# Patient Record
Sex: Female | Born: 1980 | Race: White | Hispanic: No | Marital: Married
Health system: Southern US, Community
[De-identification: ages and names within clinical notes are randomized; demographics above are authoritative.]

## PROBLEM LIST (undated history)

## (undated) HISTORY — PX: CHOLECYSTECTOMY: SHX55

## (undated) HISTORY — PX: RHINOPLASTY: SUR1284

---

## 2021-03-09 ENCOUNTER — Other Ambulatory Visit (HOSPITAL_COMMUNITY): Payer: Self-pay | Admitting: Neurology

## 2021-03-09 ENCOUNTER — Other Ambulatory Visit: Payer: Self-pay | Admitting: Neurology

## 2021-03-09 DIAGNOSIS — G43719 Chronic migraine without aura, intractable, without status migrainosus: Secondary | ICD-10-CM

## 2021-03-09 DIAGNOSIS — M542 Cervicalgia: Secondary | ICD-10-CM

## 2021-03-22 ENCOUNTER — Ambulatory Visit
Admission: RE | Admit: 2021-03-22 | Discharge: 2021-03-22 | Disposition: A | Discharge status: Home / Self Care | Payer: BC Managed Care – PPO | Source: Ambulatory Visit | Attending: Neurology | Admitting: Neurology

## 2021-03-22 ENCOUNTER — Other Ambulatory Visit: Payer: Self-pay

## 2021-03-22 DIAGNOSIS — M542 Cervicalgia: Secondary | ICD-10-CM | POA: Diagnosis present

## 2021-03-22 DIAGNOSIS — G43719 Chronic migraine without aura, intractable, without status migrainosus: Secondary | ICD-10-CM | POA: Insufficient documentation

## 2021-03-22 IMAGING — MR MR HEAD WO/W CM
13 series · 48 of 48 positions shown · IV contrast (gadavist)
Comparison: No pertinent prior exams available for comparison.

CLINICAL DATA: Intractable chronic migraine without CHIKA and
without status migrainosus. Additional history provided by scanning
technologist: Patient reports fall hitting right side of head,
patient states concern for dural tear. History of migraines.

EXAM:
MRI HEAD WITHOUT AND WITH CONTRAST
TECHNIQUE: Multiplanar, multiecho pulse sequences of the brain and surrounding
structures were obtained without and with intravenous contrast.
CONTRAST:  8mL GADAVIST GADOBUTROL 1 MMOL/ML IV SOLN

[Series 5: ax dwi_tracew · axial · 3.0mm · 0.60mm/px · z∈[-104,+44]mm · 4 of 48 slices shown]
[im 1/48]
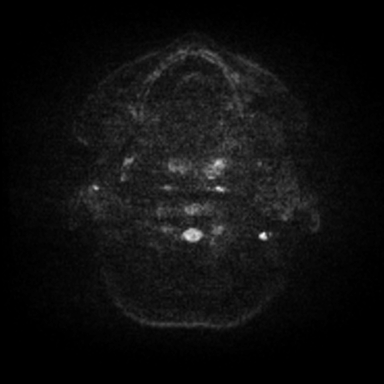
[im 16/48]
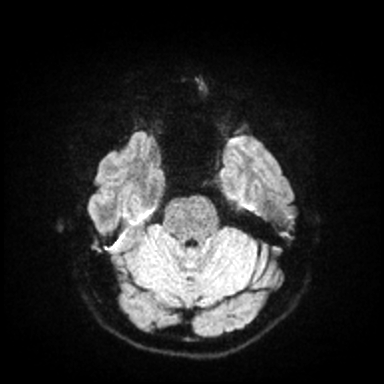
[im 32/48]
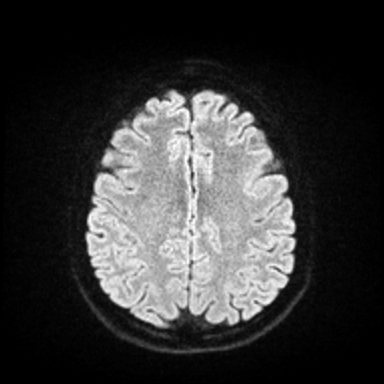
[im 48/48]
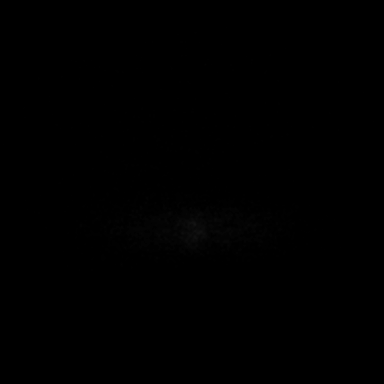

[Series 6: ax dwi_adc · axial · 3.0mm · 0.60mm/px · z∈[-104,+41]mm · 3 of 47 slices shown]
[im 1/47]
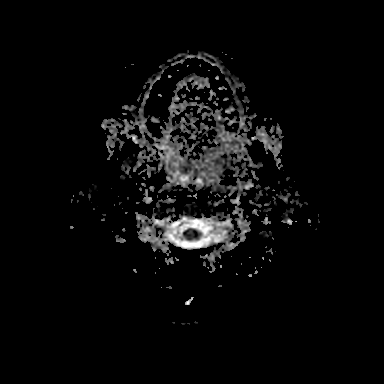
[im 24/47]
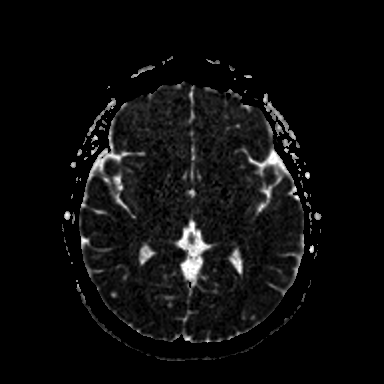
[im 47/47]
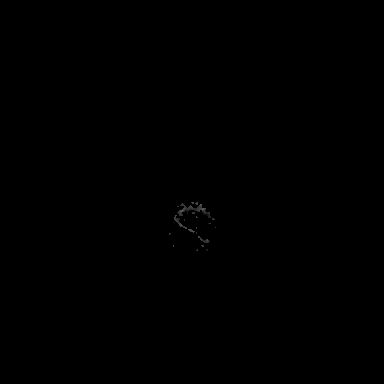

[Series 7: cor dwi_tracew · coronal · 5.0mm · 0.60mm/px · 2 of 38 slices shown]
[im 1/38]
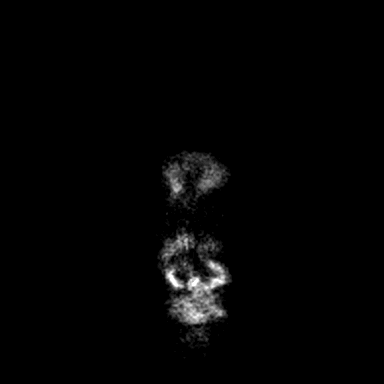
[im 38/38]
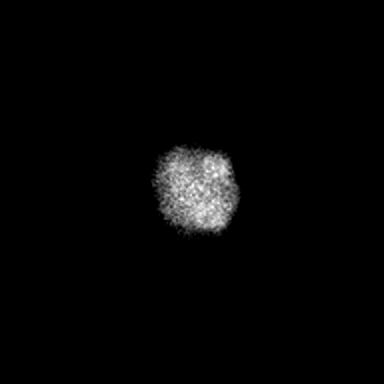

[Series 8: cor dwi_adc · coronal · 5.0mm · 0.60mm/px · 2 of 38 slices shown]
[im 1/38]
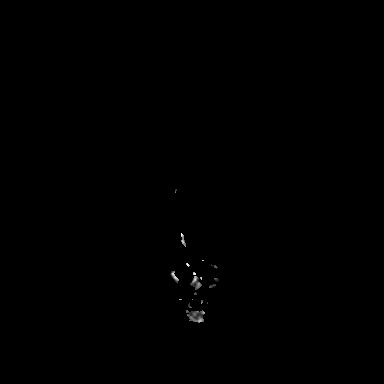
[im 38/38]
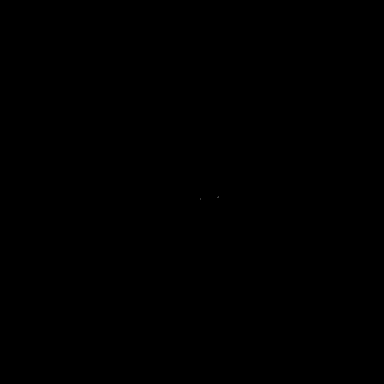

[Series 9: T1 · sagittal · 5.0mm · 0.62mm/px · 1 of 25 slices shown (1 of 2)]
[im 1/25]
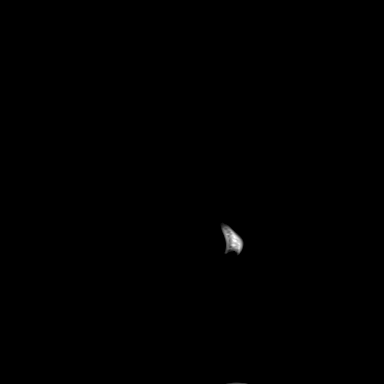

[Series 10: T2 · axial · 5.0mm · 0.53mm/px · 1 of 25 slices shown]
[im 1/25]
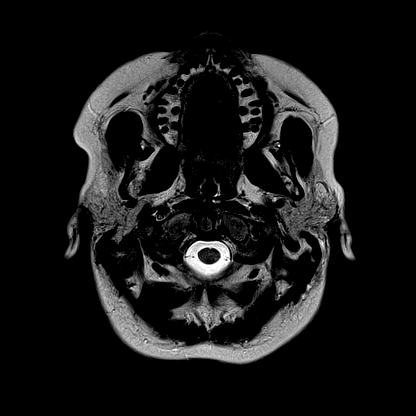

[Series 12: pha_images · axial · 3.0mm · 0.90mm/px · z∈[-115,+45]mm · 3 of 56 slices shown]
[im 1/56]
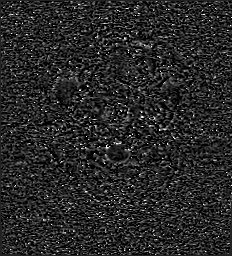
[im 28/56]
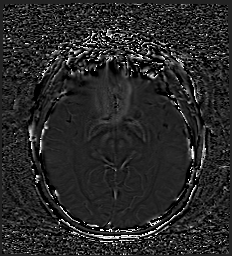
[im 56/56]
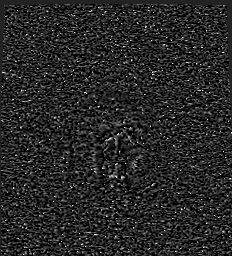

[Series 13: swi_images · axial · 3.0mm · 0.90mm/px · z∈[-115,+54]mm · 4 of 60 slices shown]
[im 1/60]
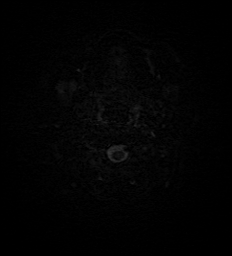
[im 20/60]
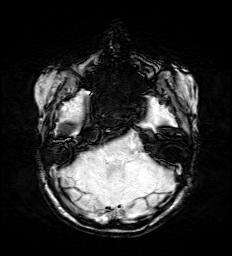
[im 40/60]
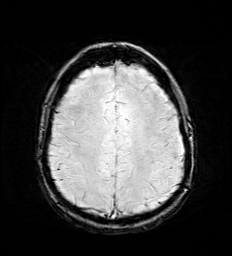
[im 60/60]
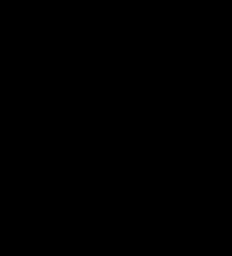

[Series 15: FLAIR · axial · 3.0mm · 0.53mm/px · z∈[-106,+48]mm · 3 of 55 slices shown]
[im 1/55]
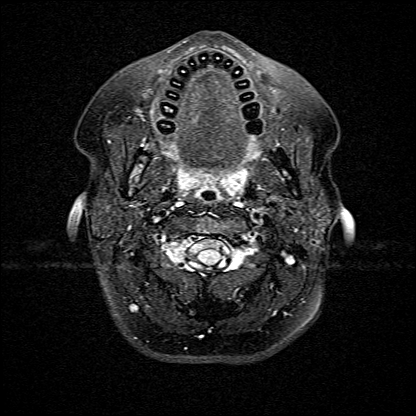
[im 28/55]
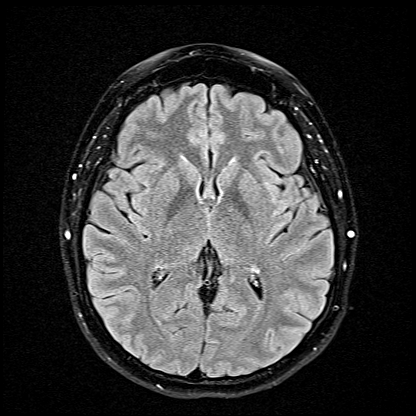
[im 55/55]
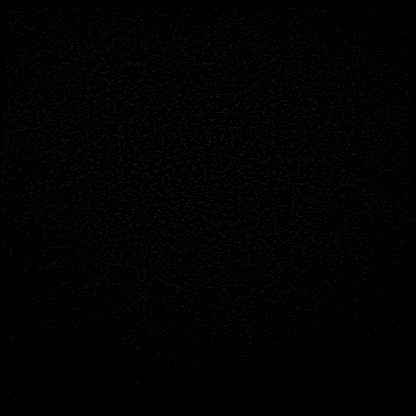

[Series 16: T1 · axial · 1.0mm · 0.98mm/px · z∈[-116,+50]mm · 10 of 175 slices shown (2 of 2)]
[im 1/175]
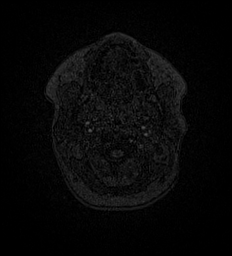
[im 20/175]
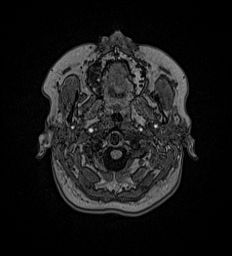
[im 39/175]
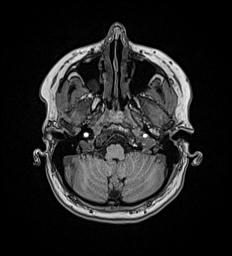
[im 59/175]
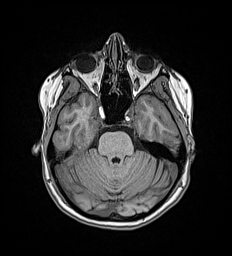
[im 78/175]
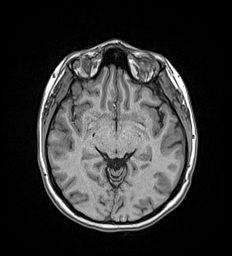
[im 97/175]
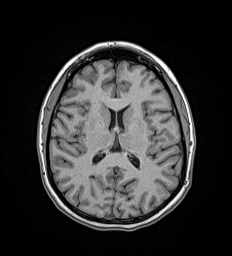
[im 117/175]
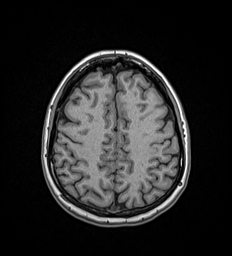
[im 136/175]
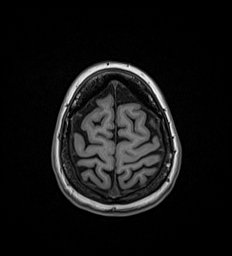
[im 155/175]
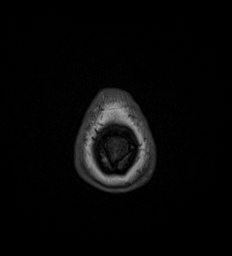
[im 175/175]
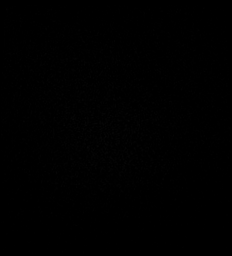

[Series 17: T2 post-contrast · coronal · 5.0mm · 0.57mm/px · 2 of 29 slices shown]
[im 1/29]
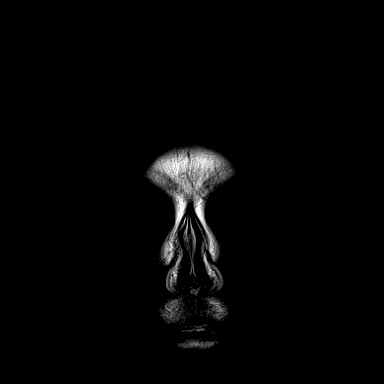
[im 29/29]
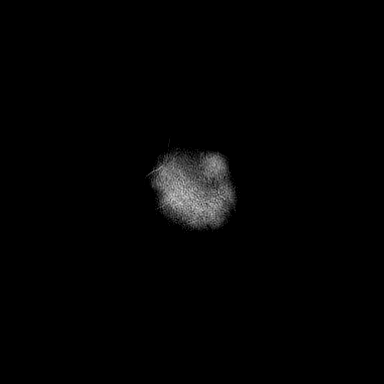

[Series 28: T1 post-contrast · axial · 1.0mm · 0.98mm/px · z∈[-116,+50]mm · 11 of 176 slices shown (1 of 2)]
[im 1/176]
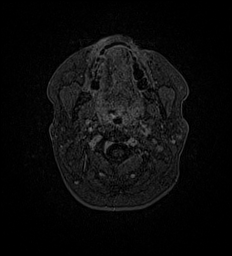
[im 18/176]
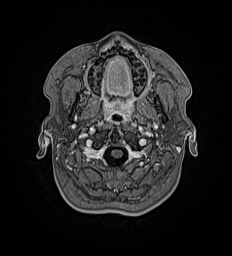
[im 36/176]
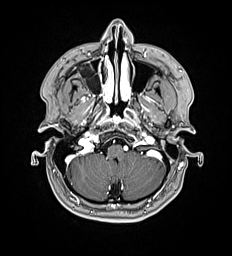
[im 53/176]
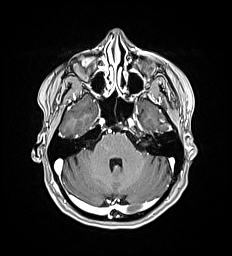
[im 71/176]
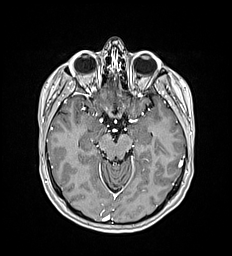
[im 88/176]
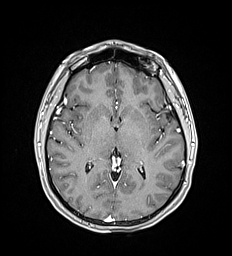
[im 106/176]
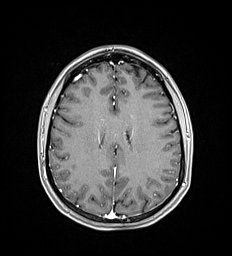
[im 123/176]
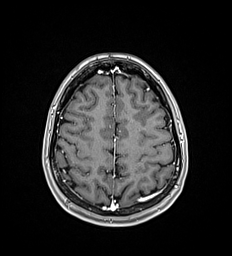
[im 141/176]
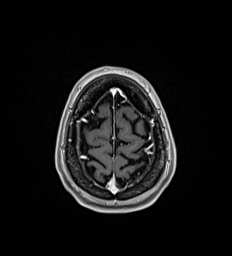
[im 158/176]
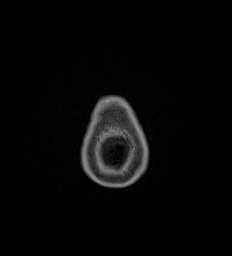
[im 176/176]
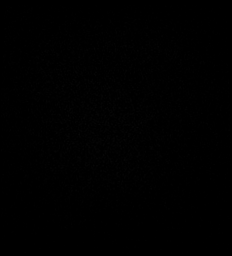

[Series 29: T1 post-contrast · coronal · 5.0mm · 0.57mm/px · 2 of 29 slices shown (2 of 2)]
[im 1/29]
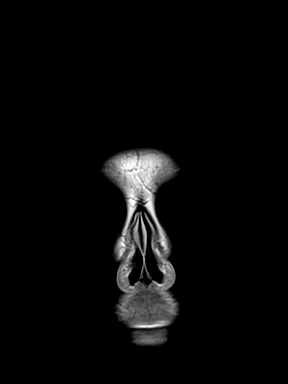
[im 29/29]
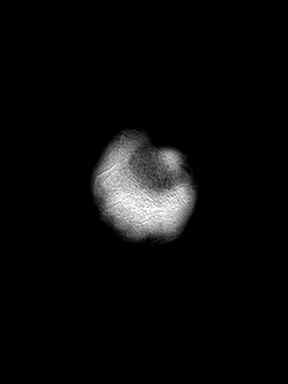

[48 of 48 positions shown; findings below may reference images not displayed]

FINDINGS: Brain:

Cerebral volume is normal.

No cortical encephalomalacia is identified.

No significant white matter disease for age.

There is no acute infarct.

No evidence of intracranial mass.

No chronic intracranial blood products.

No extra-axial fluid collection.

No midline shift.

No abnormal intracranial enhancement.

Vascular: Expected proximal arterial flow voids.

Skull and upper cervical spine: No focal marrow lesion.

Sinuses/Orbits: Visualized orbits show no acute finding. 3.1 cm
right maxillary sinus mucous retention cyst. Trace mucosal
thickening and small mucous retention cyst within the left sphenoid
sinus.
IMPRESSION: Unremarkable MRI appearance of the brain. No evidence of acute
intracranial abnormality.

3.1 cm right maxillary sinus mucous retention cyst. Trace mucosal
thickening and small mucous retention cyst within the left sphenoid
sinus.

## 2021-03-22 IMAGING — MR MR CERVICAL SPINE WO/W CM
5 of 7 series · 34 of 48 positions shown · IV contrast (8ml Gadavist)
Comparison: No pertinent prior exams available for comparison.

CLINICAL DATA: Neck pain. Additional history provided by scanning
technologist: Patient reports fall hitting right side of head,
concern for dural tear, history of migraines.

EXAM:
MRI CERVICAL SPINE WITHOUT AND WITH CONTRAST
TECHNIQUE: Multiplanar and multiecho pulse sequences of the cervical spine, to
include the craniocervical junction and cervicothoracic junction,
were obtained without and with intravenous contrast.
CONTRAST:  8mL GADAVIST GADOBUTROL 1 MMOL/ML IV SOLN

[Series 22: T2 · sagittal · 3.0mm · 0.62mm/px · 6 of 15 slices shown (1 of 2)]
[im 1/15]
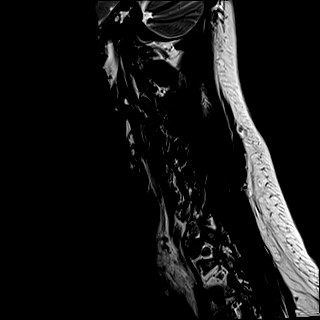
[im 3/15]
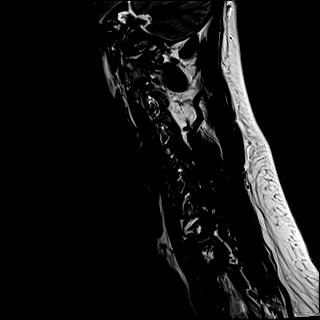
[im 6/15]
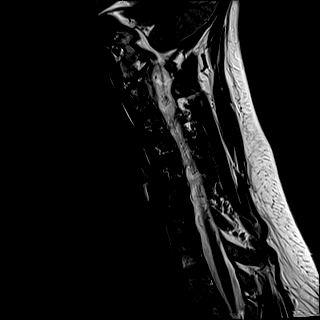
[im 9/15]
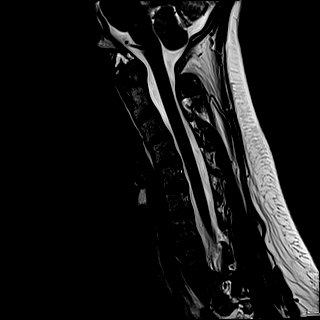
[im 12/15]
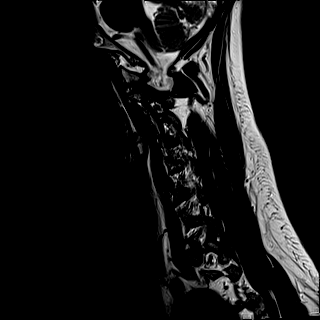
[im 15/15]
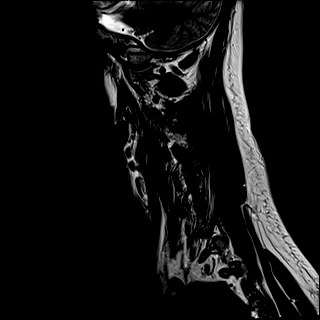

[Series 23: FLAIR · sagittal · 3.0mm · 0.78mm/px · 5 of 15 slices shown]
[im 1/15]
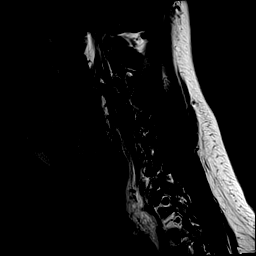
[im 4/15]
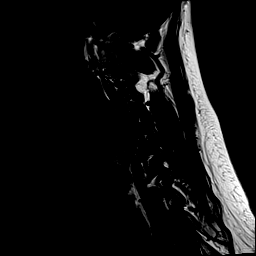
[im 8/15]
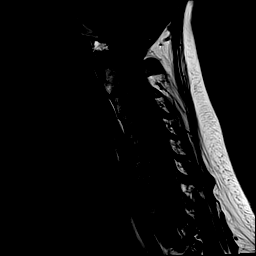
[im 11/15]
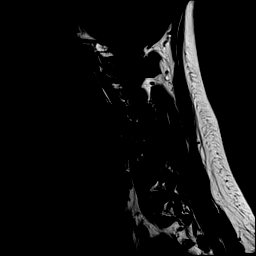
[im 15/15]
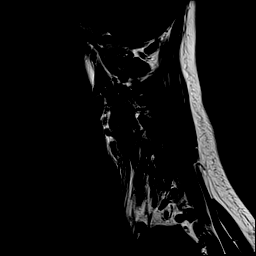

[Series 24: STIR · sagittal · 3.0mm · 0.62mm/px · 5 of 15 slices shown]
[im 1/15]
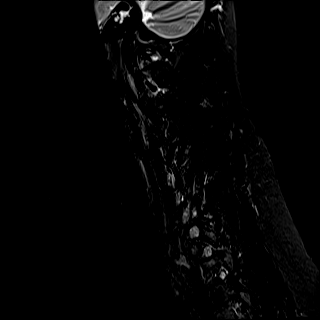
[im 4/15]
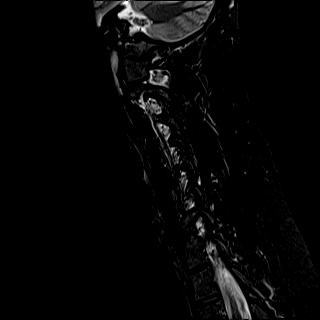
[im 8/15]
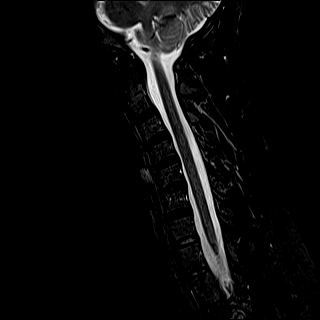
[im 11/15]
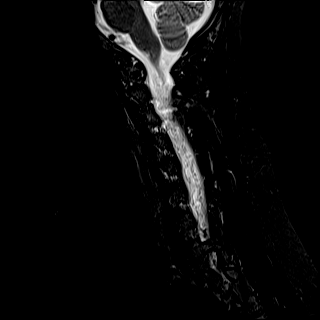
[im 15/15]
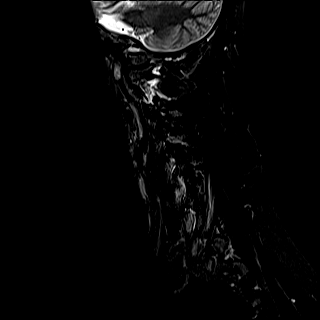

[Series 25: T2 · axial · 3.0mm · 0.70mm/px · z∈[-210,-118]mm · 9 of 29 slices shown (2 of 2)]
[im 1/29]
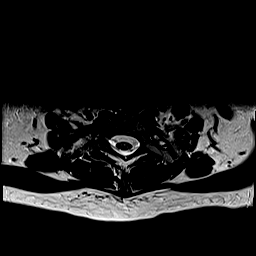
[im 4/29]
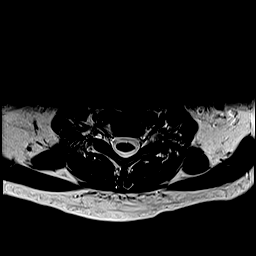
[im 8/29]
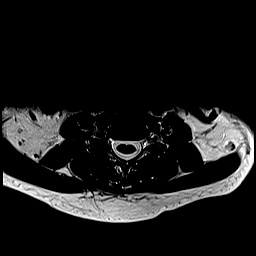
[im 11/29]
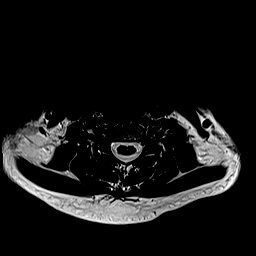
[im 15/29]
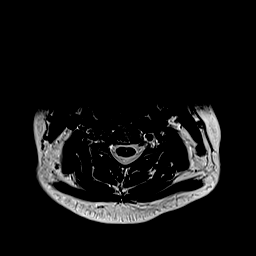
[im 18/29]
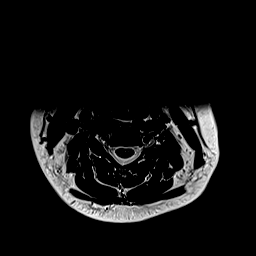
[im 22/29]
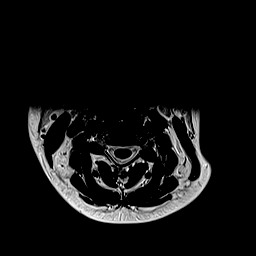
[im 25/29]
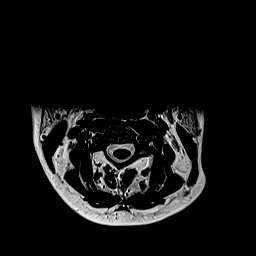
[im 29/29]
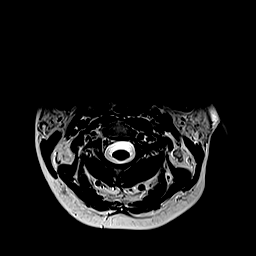

[Series 31: T1 post-contrast · axial · 3.0mm · 0.35mm/px · z∈[-210,-118]mm · 9 of 29 slices shown]
[im 1/29]
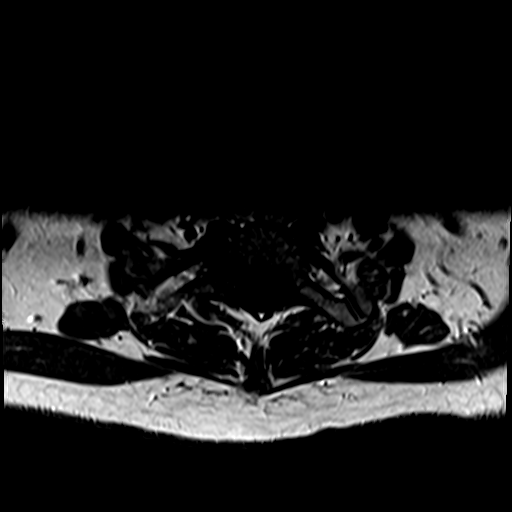
[im 4/29]
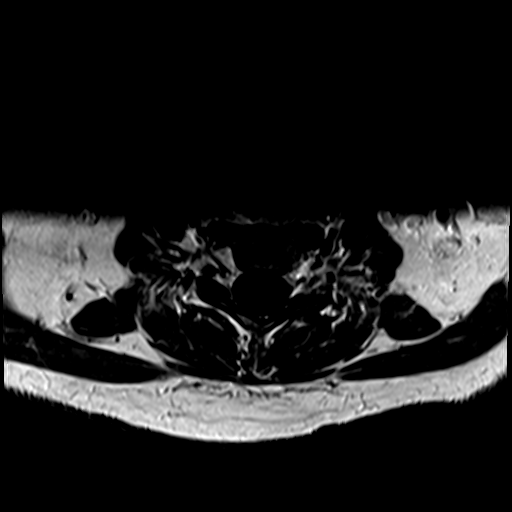
[im 8/29]
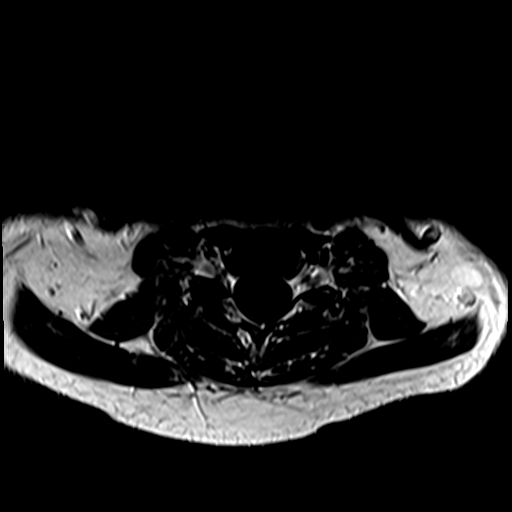
[im 11/29]
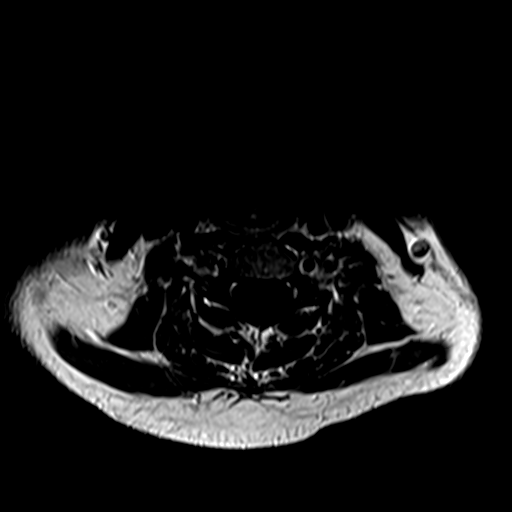
[im 15/29]
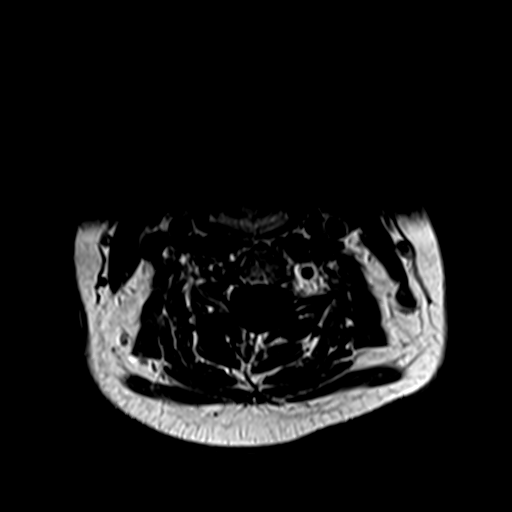
[im 18/29]
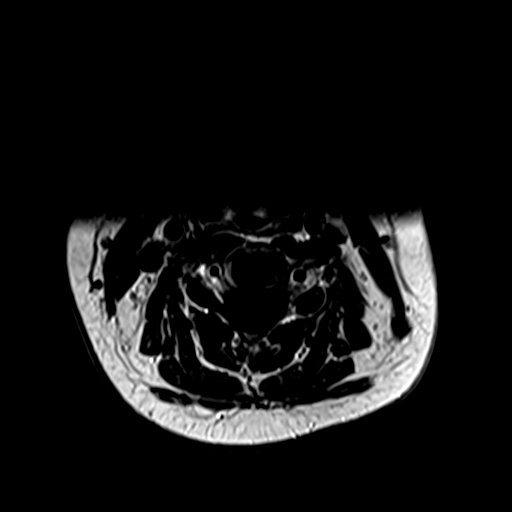
[im 22/29]
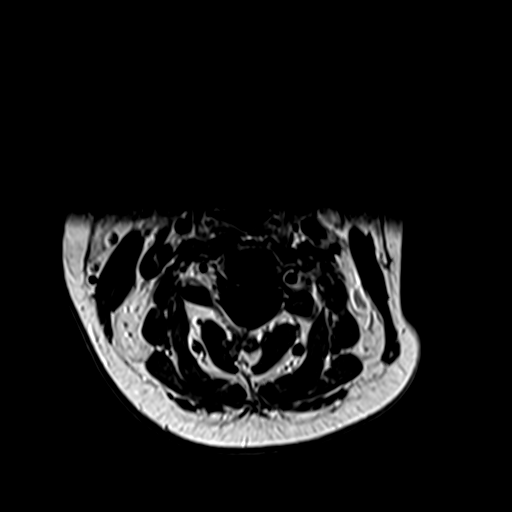
[im 25/29]
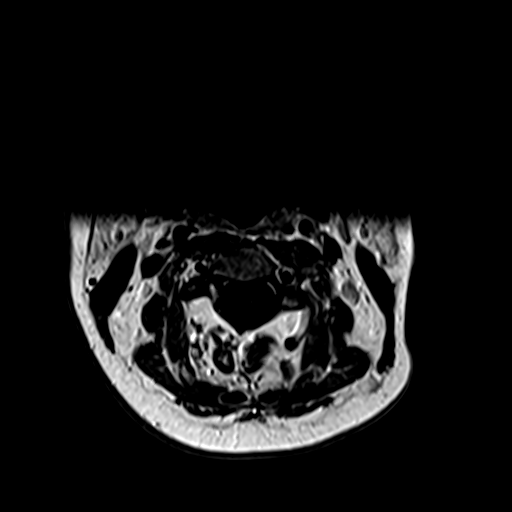
[im 29/29]
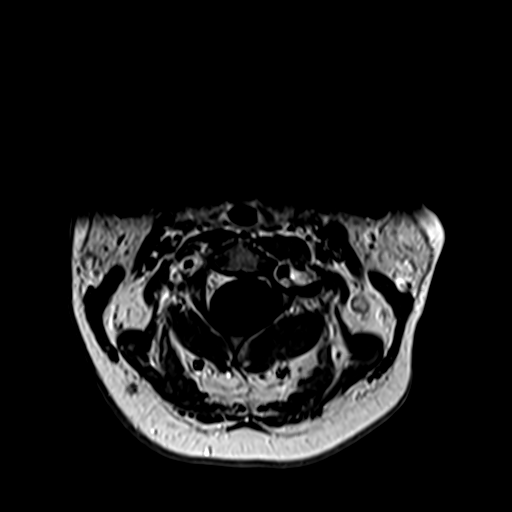

[34 of 48 positions shown; findings below may reference images not displayed]

FINDINGS: Alignment: Reversal of the expected cervical lordosis. Slight
cervical levocurvature. Trace C2-C3 and C3-C4 grade 1
anterolisthesis.

Vertebrae: Vertebral body height is maintained. No significant
marrow edema or focal suspicious osseous lesion.

Cord: No spinal cord signal abnormality or abnormal cord
enhancement.

Posterior Fossa, vertebral arteries, paraspinal tissues: Posterior
fossa better assessed on same-day brain MRI. Flow voids preserved
within the imaged cervical vertebral arteries. Paraspinal soft
tissues within normal limits.

Disc levels:

Disc desiccation with preserved disc height throughout the cervical
spine.

C2-C3: Trace grade 1 anterolisthesis. No significant disc herniation
or stenosis.

C3-C4: Trace grade 1 anterolisthesis. No significant disc herniation
or stenosis.

C4-C5: Minimal disc bulging. No significant spinal canal or
foraminal stenosis.

C5-C6: Minimal disc bulging. No significant spinal canal or
foraminal stenosis.

C6-C7: No significant disc herniation or stenosis.

C7-T1: No significant disc herniation or stenosis.
IMPRESSION: Disc desiccation with preserved disc height throughout the cervical
spine. Minimal disc bulging at C4-C5 and C5-C6. No significant
spinal canal or foraminal stenosis.

Trace C2-C3 and C3-C4 grade 1 anterolisthesis.

Reversal of the expected cervical lordosis.

Mild cervical levocurvature.

## 2021-03-22 MED ORDER — GADOBUTROL 1 MMOL/ML IV SOLN
8.0000 mL | Freq: Once | INTRAVENOUS | Status: AC | PRN
  Administered 2021-03-22: 8 mL via INTRAVENOUS

## 2021-08-03 ENCOUNTER — Emergency Department: Payer: BC Managed Care – PPO

## 2021-08-03 ENCOUNTER — Emergency Department
Admission: EM | Admit: 2021-08-03 | Discharge: 2021-08-04 | Disposition: A | Discharge status: Home / Self Care | Payer: BC Managed Care – PPO | Attending: Emergency Medicine | Admitting: Emergency Medicine

## 2021-08-03 ENCOUNTER — Other Ambulatory Visit: Payer: Self-pay | Admitting: Family Medicine

## 2021-08-03 ENCOUNTER — Other Ambulatory Visit: Payer: Self-pay

## 2021-08-03 ENCOUNTER — Other Ambulatory Visit (HOSPITAL_COMMUNITY): Payer: Self-pay | Admitting: Family Medicine

## 2021-08-03 DIAGNOSIS — K5900 Constipation, unspecified: Secondary | ICD-10-CM

## 2021-08-03 DIAGNOSIS — F419 Anxiety disorder, unspecified: Secondary | ICD-10-CM | POA: Insufficient documentation

## 2021-08-03 DIAGNOSIS — D1803 Hemangioma of intra-abdominal structures: Secondary | ICD-10-CM | POA: Diagnosis not present

## 2021-08-03 DIAGNOSIS — R112 Nausea with vomiting, unspecified: Secondary | ICD-10-CM

## 2021-08-03 DIAGNOSIS — K567 Ileus, unspecified: Secondary | ICD-10-CM | POA: Insufficient documentation

## 2021-08-03 DIAGNOSIS — R109 Unspecified abdominal pain: Secondary | ICD-10-CM | POA: Diagnosis present

## 2021-08-03 DIAGNOSIS — R1084 Generalized abdominal pain: Secondary | ICD-10-CM

## 2021-08-03 LAB — COMPREHENSIVE METABOLIC PANEL
ALT: 20 U/L (ref 0–44)
AST: 22 U/L (ref 15–41)
Albumin: 3.8 g/dL (ref 3.5–5.0)
Alkaline Phosphatase: 90 U/L (ref 38–126)
Anion gap: 8 (ref 5–15)
BUN: 8 mg/dL (ref 6–20)
CO2: 27 mmol/L (ref 22–32)
Calcium: 8.7 mg/dL — ABNORMAL LOW (ref 8.9–10.3)
Chloride: 103 mmol/L (ref 98–111)
Creatinine, Ser: 0.67 mg/dL (ref 0.44–1.00)
GFR, Estimated: >60 mL/min (ref >60)
Glucose, Bld: 96 mg/dL (ref 70–99)
Potassium: 3.5 mmol/L (ref 3.5–5.1)
Sodium: 138 mmol/L (ref 135–145)
Total Bilirubin: 0.4 mg/dL (ref 0.3–1.2)
Total Protein: 7.4 g/dL (ref 6.5–8.1)

## 2021-08-03 LAB — LIPASE, BLOOD: Lipase: 28 U/L (ref 11–51)

## 2021-08-03 IMAGING — CT CT ABD-PELV W/ CM
2 of 4 series · 16 of 46 positions shown, 18 images · IV contrast (APPLIED)
Comparison: None.

CLINICAL DATA: Acute generalized abdominal pain.

EXAM:
CT ABDOMEN AND PELVIS WITH CONTRAST
TECHNIQUE: Multidetector CT imaging of the abdomen and pelvis was performed
using the standard protocol following bolus administration of
intravenous contrast.
CONTRAST:  100mL OMNIPAQUE IOHEXOL 350 MG/ML SOLN

[Series 2: routine abd/pel with · axial · 0.96mm/px · z∈[-306,+129]mm · 13 of 95 slices shown, 15 images]
[im 4/95  soft-tissue]
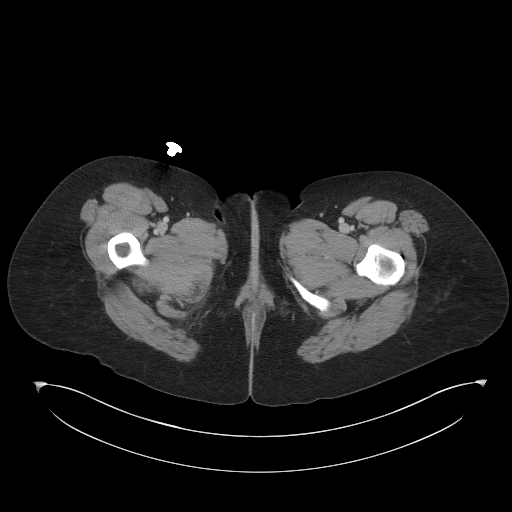
[im 4/95  bone]
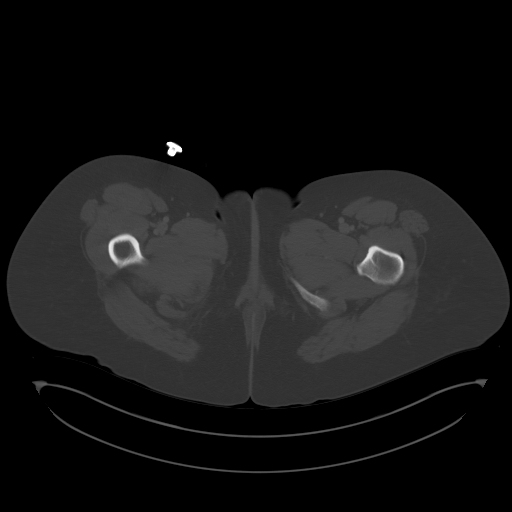
[im 12/95  soft-tissue]
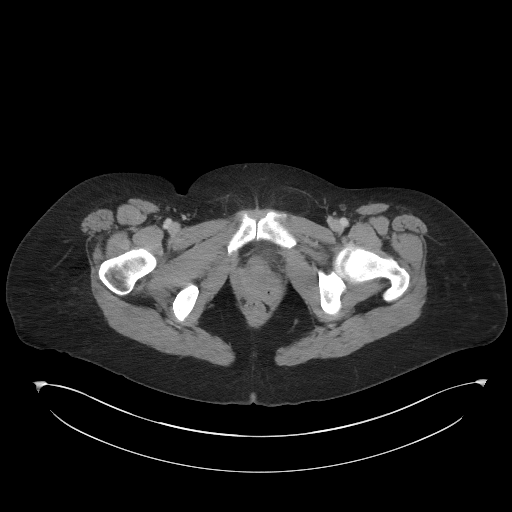
[im 20/95  soft-tissue]
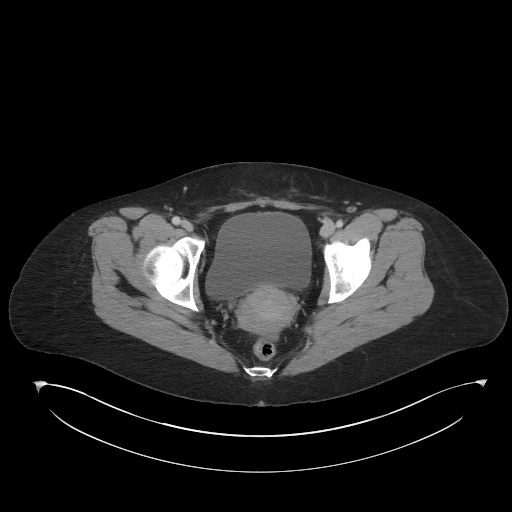
[im 28/95  soft-tissue]
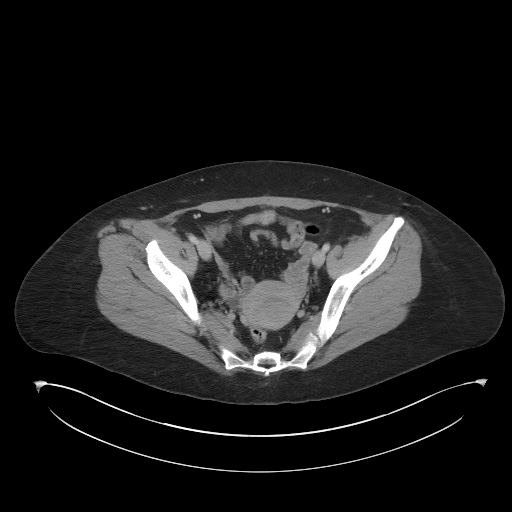
[im 32/95  soft-tissue]
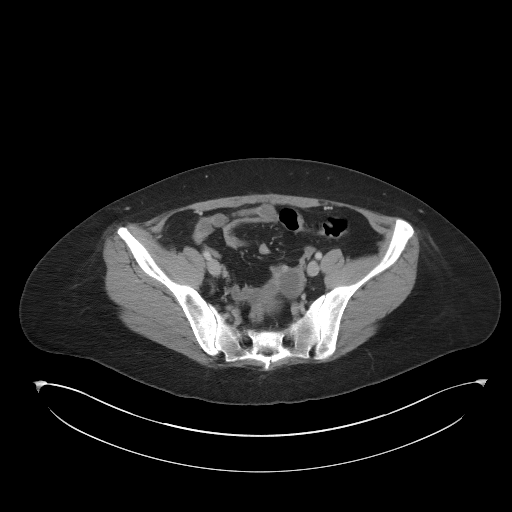
[im 40/95  soft-tissue]
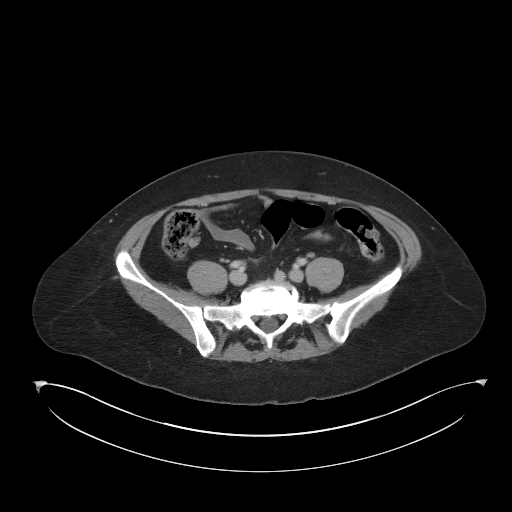
[im 48/95  soft-tissue]
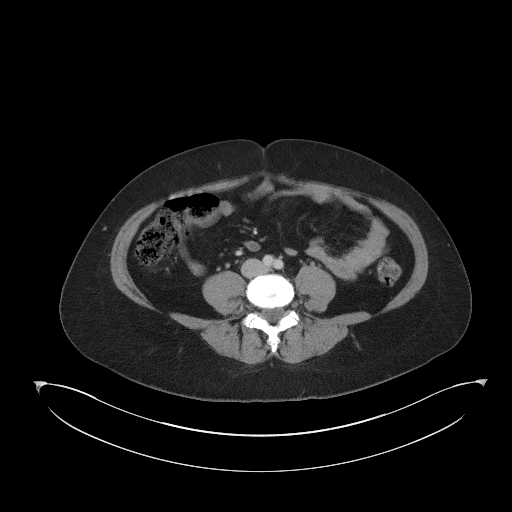
[im 55/95  soft-tissue]
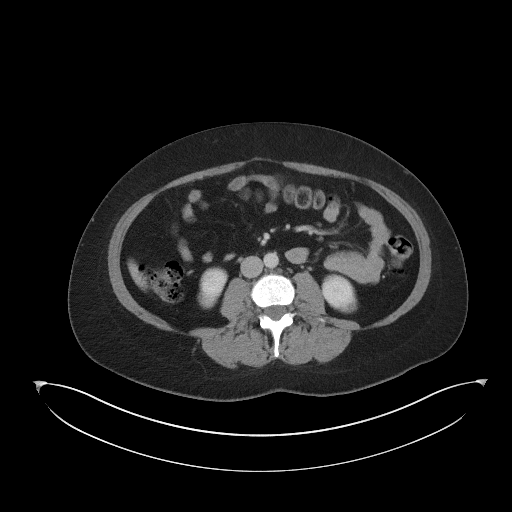
[im 63/95  soft-tissue]
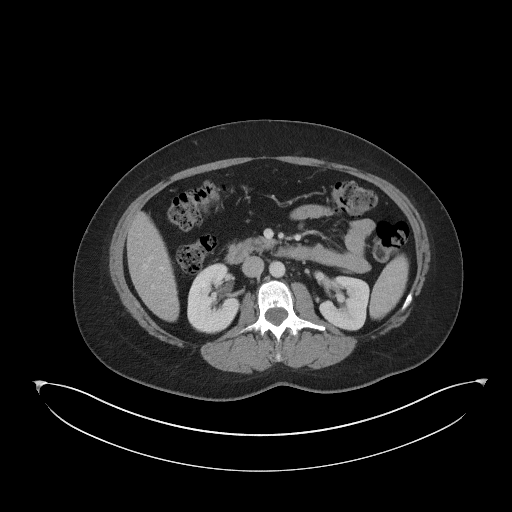
[im 63/95  bone]
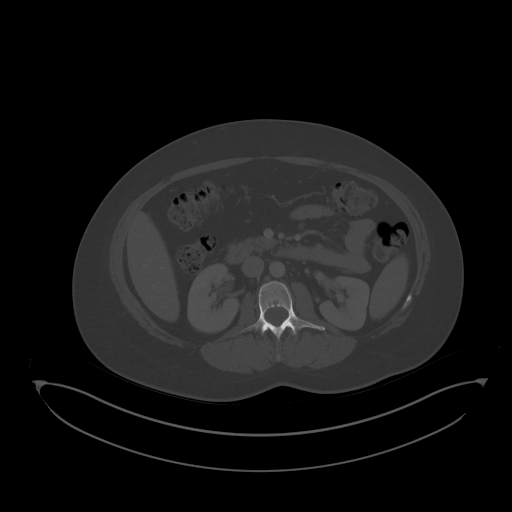
[im 67/95  soft-tissue]
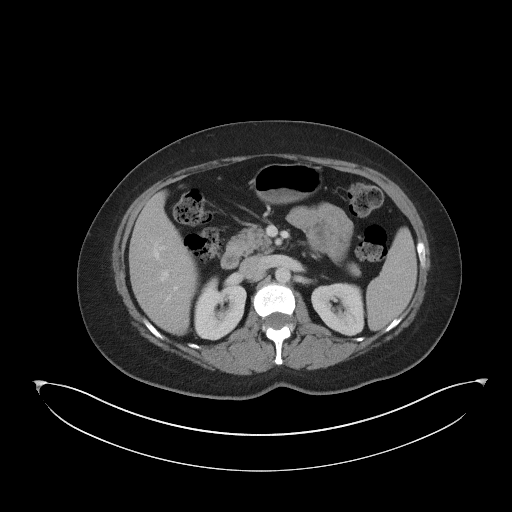
[im 75/95  soft-tissue]
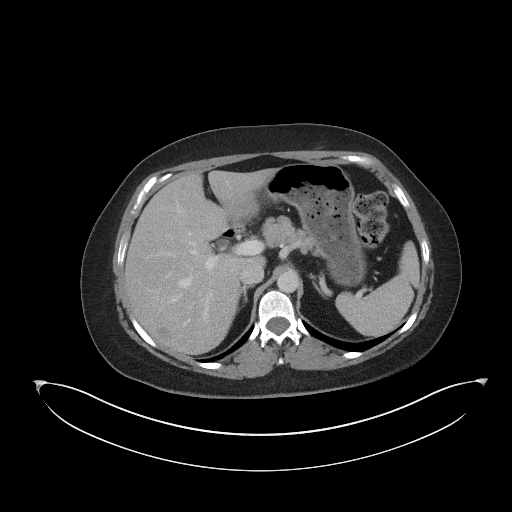
[im 83/95  soft-tissue]
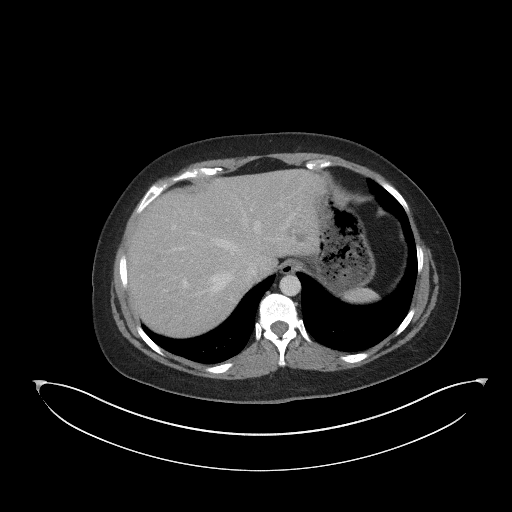
[im 91/95  soft-tissue]
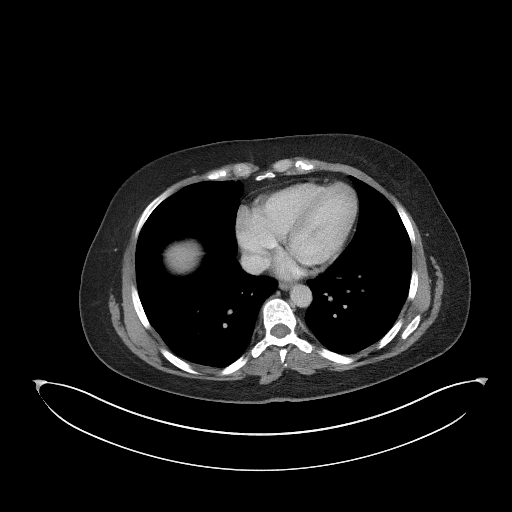

[Series 5: coronal st · coronal · 0.79mm/px · 3 of 93 slices shown]
[im 31/93  soft-tissue]
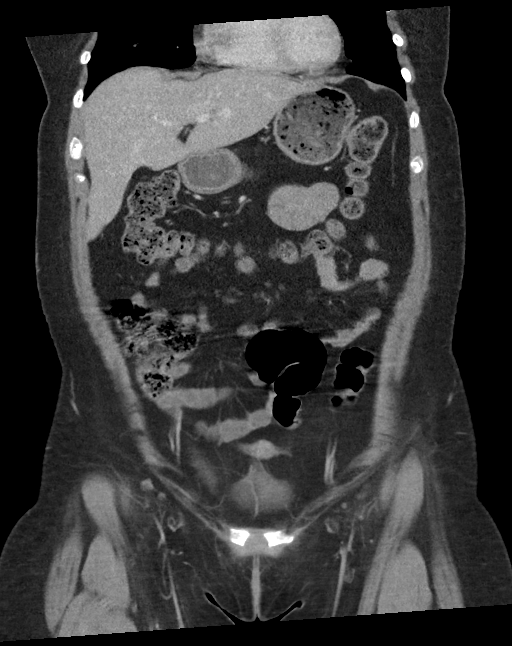
[im 41/93  soft-tissue]
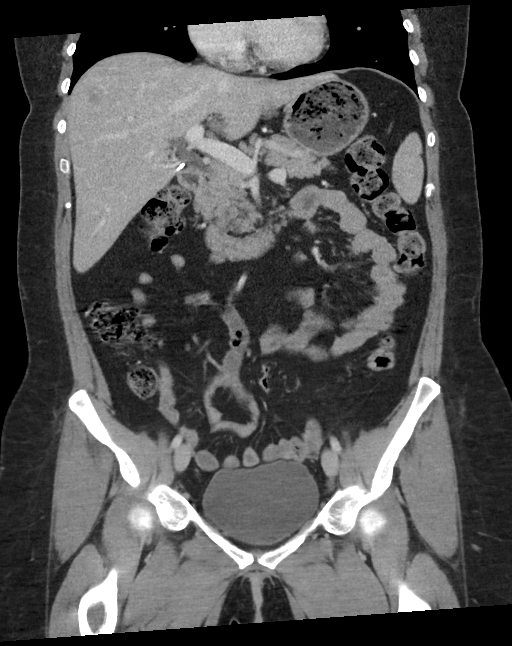
[im 52/93  soft-tissue]
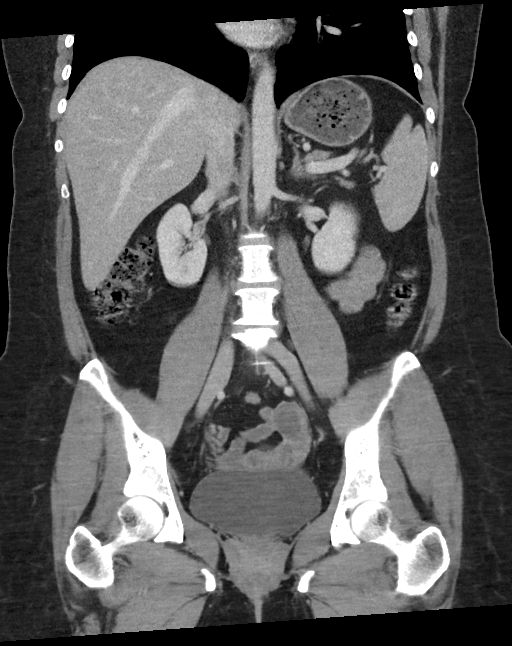

[16 of 46 positions shown; findings below may reference images not displayed]

FINDINGS: Lower chest: No acute abnormality.

Hepatobiliary: Status post cholecystectomy. No biliary dilatation is
noted. At least 3 hypodense areas are noted in the hepatic
parenchyma with probable peripheral nodular enhancement most
consistent with hemangiomas, but it is not diagnostic for this on
the basis of this exam.

Pancreas: Unremarkable. No pancreatic ductal dilatation or
surrounding inflammatory changes.

Spleen: Normal in size without focal abnormality.

Adrenals/Urinary Tract: Adrenal glands are unremarkable. Kidneys are
normal, without renal calculi, focal lesion, or hydronephrosis.
Bladder is unremarkable.

Stomach/Bowel: Stomach is within normal limits. Appendix appears
normal. No evidence of bowel wall thickening, distention, or
inflammatory changes.

Vascular/Lymphatic: No significant vascular findings are present. No
enlarged abdominal or pelvic lymph nodes.

Reproductive: Uterus and bilateral adnexa are unremarkable.

Other: No hernia is noted. Small amount of free fluid is noted in
the dependent portion of the pelvis which most likely is
physiologic.

Musculoskeletal: No acute or significant osseous findings.
IMPRESSION: Three hepatic lesions are noted which most likely represent
hemangiomas, but it is not diagnostic for this on the basis of this
exam. When the patient is clinically stable and able to follow
directions and hold their breath (preferably as an outpatient)
further evaluation with dedicated abdominal MRI with and without
gadolinium should be considered to exclude other pathology.

No other definite abnormality seen in the abdomen or pelvis.

## 2021-08-03 MED ORDER — IOHEXOL 350 MG/ML SOLN
100.0000 mL | Freq: Once | INTRAVENOUS | Status: AC | PRN
  Administered 2021-08-03: 100 mL via INTRAVENOUS

## 2021-08-03 NOTE — ED Triage Notes (Signed)
Pt states that she was seen at her dr office at 1655 and was told that she may have appendicitis and needs a CT scan- pt has had abd pain, nausea, and dizziness- pt states she has been having symptoms for about 3 days

## 2021-08-03 NOTE — ED Provider Notes (Signed)
Emergency Medicine Provider Triage Evaluation Note  Annette Erickson , a 40 y.o. female  was evaluated in triage.  Pt complains of generalized abdominal pain and nausea, referred from PCP w/ concern for appy.  Review of Systems  Positive: Abd pain, nausea Negative: Fever, burning w/ urination, diarrhea  Physical Exam  BP 121/89 (BP Location: Left Arm)   Pulse 84   Temp 98.5 F (36.9 C) (Oral)   Resp 18   Ht 5\' 5"  (1.651 m)   Wt 83.9 kg   SpO2 98%   BMI 30.79 kg/m  Gen:   Awake, no distress   Resp:  Normal effort  MSK:   Moves extremities without difficulty  Other:  Mildly TTP throughout abdomen   Medical Decision Making  Medically screening exam initiated at 7:01 PM.  Appropriate orders placed.  Annette Erickson was informed that the remainder of the evaluation will be completed by another provider, this initial triage assessment does not replace that evaluation, and the importance of remaining in the ED until their evaluation is complete.     Annette Starch, MD 08/03/21 [phone]

## 2021-08-03 NOTE — ED Notes (Signed)
Pt refusing labs and urine until she can speak with the Dr

## 2021-08-04 MED ORDER — LACTULOSE 10 GM/15ML PO SOLN: 20.0000 g | Freq: Every day | ORAL | 0 refills | Status: AC | PRN

## 2021-08-04 MED ORDER — SODIUM CHLORIDE 0.9 % IV BOLUS
1000.0000 mL | Freq: Once | INTRAVENOUS | Status: AC
  Administered 2021-08-04: 1000 mL via INTRAVENOUS

## 2021-08-04 MED ORDER — METOCLOPRAMIDE HCL 10 MG PO TABS: 10.0000 mg | ORAL_TABLET | Freq: Three times a day (TID) | ORAL | 0 refills | Status: AC | PRN

## 2021-08-04 MED ORDER — FOSFOMYCIN TROMETHAMINE 3 G PO PACK
3.0000 g | PACK | Freq: Once | ORAL | Status: AC
  Administered 2021-08-04: 3 g via ORAL
  Filled 2021-08-04: qty 3

## 2021-08-04 MED ORDER — METOCLOPRAMIDE HCL 5 MG/ML IJ SOLN
5.0000 mg | Freq: Once | INTRAMUSCULAR | Status: AC
  Administered 2021-08-04: 5 mg via INTRAVENOUS
  Filled 2021-08-04: qty 2

## 2021-08-04 NOTE — Discharge Instructions (Addendum)
1.  You may take Lactulose as needed for bowel movements. 2.  You may take Reglan as needed for nausea. 3.  Drink plenty of fluids daily. 4.  Discussed with your doctor about getting an outpatient MRI of your liver to further evaluate hemangiomas. 5.  Return to the ER for worsening symptoms, persistent vomiting, difficulty breathing or other concerns.

## 2021-08-04 NOTE — ED Notes (Signed)
Patient demanded discharge home reporting "anxiety"; refused vitals at this time

## 2021-08-04 NOTE — ED Provider Notes (Signed)
[facility] Emergency Department Provider Note   ____________________________________________   Event Date/Time   First MD Initiated Contact with Patient 08/03/21 2347     (approximate)  I have reviewed the triage vital signs and the nursing notes.   HISTORY  Chief Complaint Abdominal Pain    HPI Annette Erickson is a 40 y.o. female referred to the ED by her PCP for CT scan to evaluate for appendicitis.  Patient with a history of POTS and Ehlers-Danlos syndrome who has been having a 3-day history of generalized abdominal pain, nausea without vomiting, constipation and dizziness.  Her children have had viral URI symptoms.  Had urine and lab work done at her PCPs office today including negative COVID test.  Denies fever, chest pain, cough, shortness of breath, vomiting or diarrhea.  Denies COVID-19 exposure.     Past medical history POTS Ehlers-Danlos syndrome  There are no problems to display for this patient.   Past Surgical History:  Procedure Laterality Date   CESAREAN SECTION     x2   CHOLECYSTECTOMY     RHINOPLASTY      Prior to Admission medications   Medication Sig Start Date End Date Taking? Authorizing Provider  lactulose (CHRONULAC) 10 GM/15ML solution Take 30 mLs (20 g total) by mouth daily as needed for mild constipation. 08/04/21  Yes Paulette Blanch, MD  metoCLOPramide (REGLAN) 10 MG tablet Take 1 tablet (10 mg total) by mouth every 8 (eight) hours as needed for nausea. 08/04/21  Yes Paulette Blanch, MD    Allergies Percocet [oxycodone-acetaminophen]  History reviewed. No pertinent family history.  Social History    Review of Systems  Constitutional: No fever/chills Eyes: No visual changes. ENT: No sore throat. Cardiovascular: Denies chest pain. Respiratory: Denies shortness of breath. Gastrointestinal: Positive for abdominal pain and nausea, no vomiting.  No diarrhea.  Positive for constipation. Genitourinary: Negative for  dysuria. Musculoskeletal: Negative for back pain. Skin: Negative for rash. Neurological: Positive for dizziness.  Negative for headaches, focal weakness or numbness.   ____________________________________________   PHYSICAL EXAM:  VITAL SIGNS: ED Triage Vitals  Enc Vitals Group     BP 08/03/21 1803 121/89     Pulse Rate 08/03/21 1803 84     Resp 08/03/21 1803 18     Temp 08/03/21 1803 98.5 F (36.9 C)     Temp Source 08/03/21 1803 Oral     SpO2 08/03/21 1803 98 %     Weight 08/03/21 1802 185 lb (83.9 kg)     Height 08/03/21 1802 5\' 5"  (1.651 m)     Head Circumference --      Peak Flow --      Pain Score 08/03/21 1802 0     Pain Loc --      Pain Edu? --      Excl. in [place]? --     Constitutional: Alert and oriented. Well appearing and in no acute distress. Eyes: Conjunctivae are normal. PERRL. EOMI. Head: Atraumatic. Nose: No congestion/rhinnorhea. Mouth/Throat: Mucous membranes are moist.   Neck: No stridor.   Cardiovascular: Normal rate, regular rhythm. Grossly normal heart sounds.  Good peripheral circulation. Respiratory: Normal respiratory effort.  No retractions. Lungs CTAB. Gastrointestinal: Soft with minimal diffuse tenderness to palpation without rebound or guarding. No distention. No abdominal bruits. No CVA tenderness. Musculoskeletal: No lower extremity tenderness nor edema.  No joint effusions. Neurologic:  Normal speech and language. No gross focal neurologic deficits are appreciated. No gait instability. Skin:  Skin is warm, dry and intact. No rash noted. Psychiatric: Mood and affect are normal. Speech and behavior are normal.  ____________________________________________   LABS (all labs ordered are listed, but only abnormal results are displayed)  Labs Reviewed  COMPREHENSIVE METABOLIC PANEL - Abnormal; Notable for the following components:      Result Value   Calcium 8.7 (*)    All other components within normal limits  LIPASE, BLOOD    ____________________________________________  EKG  None ____________________________________________  RADIOLOGY I, Joette Schmoker J, personally viewed and evaluated these images (plain radiographs) as part of my medical decision making, as well as reviewing the written report by the radiologist.  ED MD interpretation: Incidental liver hemangiomas, otherwise unremarkable, normal appendix  Official radiology report(s): CT ABDOMEN PELVIS W CONTRAST  Result Date: 08/03/2021 CLINICAL DATA:  Acute generalized abdominal pain. EXAM: CT ABDOMEN AND PELVIS WITH CONTRAST TECHNIQUE: Multidetector CT imaging of the abdomen and pelvis was performed using the standard protocol following bolus administration of intravenous contrast. CONTRAST:  168mL OMNIPAQUE IOHEXOL 350 MG/ML SOLN COMPARISON:  None. FINDINGS: Lower chest: No acute abnormality. Hepatobiliary: Status post cholecystectomy. No biliary dilatation is noted. At least 3 hypodense areas are noted in the hepatic parenchyma with probable peripheral nodular enhancement most consistent with hemangiomas, but it is not diagnostic for this on the basis of this exam. Pancreas: Unremarkable. No pancreatic ductal dilatation or surrounding inflammatory changes. Spleen: Normal in size without focal abnormality. Adrenals/Urinary Tract: Adrenal glands are unremarkable. Kidneys are normal, without renal calculi, focal lesion, or hydronephrosis. Bladder is unremarkable. Stomach/Bowel: Stomach is within normal limits. Appendix appears normal. No evidence of bowel wall thickening, distention, or inflammatory changes. Vascular/Lymphatic: No significant vascular findings are present. No enlarged abdominal or pelvic lymph nodes. Reproductive: Uterus and bilateral adnexa are unremarkable. Other: No hernia is noted. Small amount of free fluid is noted in the dependent portion of the pelvis which most likely is physiologic. Musculoskeletal: No acute or significant osseous findings.  IMPRESSION: Three hepatic lesions are noted which most likely represent hemangiomas, but it is not diagnostic for this on the basis of this exam. When the patient is clinically stable and able to follow directions and hold their breath (preferably as an outpatient) further evaluation with dedicated abdominal MRI with and without gadolinium should be considered to exclude other pathology. No other definite abnormality seen in the abdomen or pelvis. Electronically Signed   By: Marijo Conception M.D.   On: 08/03/2021 20:58    ____________________________________________   PROCEDURES  Procedure(s) performed (including Critical Care):  Procedures   ____________________________________________   INITIAL IMPRESSION / ASSESSMENT AND PLAN / ED COURSE  As part of my medical decision making, I reviewed the following data within the [place] History obtained from family, Nursing notes reviewed and incorporated, Labs reviewed, Old chart reviewed, Radiograph reviewed, and Notes from prior ED visits     40 year old female referred by her PCP for CT scan to rule out appendicitis given a 3-day history of abdominal pain, nausea and dizziness. Differential diagnosis includes, but is not limited to, ovarian cyst, ovarian torsion, acute appendicitis, diverticulitis, urinary tract infection/pyelonephritis, endometriosis, bowel obstruction, colitis, renal colic, gastroenteritis, hernia, fibroids, endometriosis, pregnancy related pain including ectopic pregnancy, etc.   I have personally reviewed patient's UA, lab work from her PCPs office today.  Discussed with patient and her husband CT findings of incidental liver hemangioma as and moderate stool burden.  This is consistent with her clinical history of constipation.  Will administer IV fluids and Reglan.  Administer dose of fosfomycin for leukocyte positive UA at her PCPs office.  Would discharge home with prescription for Lactulose and Reglan.   Will reassess.  Clinical Course as of 08/04/21 0534  Sat Aug 04, 2021  0133 Patient overall feeling better.  Experiencing some anxiety and does not have any medications here with her.  Would like to be discharged so she can go home to take her medications.  Strict return precautions given.  Patient verbalizes understanding and agrees with plan of care. [JS]    Clinical Course User Index [JS] Paulette Blanch, MD     ____________________________________________   FINAL CLINICAL IMPRESSION(S) / ED DIAGNOSES  Final diagnoses:  Generalized abdominal pain  Ileus ([place])  Constipation, unspecified constipation type  Liver hemangioma     ED Discharge Orders          Ordered    metoCLOPramide (REGLAN) 10 MG tablet  Every 8 hours PRN        08/04/21 0131    lactulose (CHRONULAC) 10 GM/15ML solution  Daily PRN        08/04/21 0131             Note:  This document was prepared using Dragon voice recognition software and may include unintentional dictation errors.    Paulette Blanch, MD 08/04/21 [phone]

## 2021-08-07 ENCOUNTER — Other Ambulatory Visit: Payer: Self-pay | Admitting: Internal Medicine

## 2021-08-07 DIAGNOSIS — D1803 Hemangioma of intra-abdominal structures: Secondary | ICD-10-CM

## 2021-08-07 DIAGNOSIS — K7689 Other specified diseases of liver: Secondary | ICD-10-CM

## 2021-08-28 ENCOUNTER — Ambulatory Visit
Admission: RE | Admit: 2021-08-28 | Discharge: 2021-08-28 | Disposition: A | Discharge status: Home / Self Care | Payer: BC Managed Care – PPO | Source: Ambulatory Visit | Attending: Internal Medicine | Admitting: Internal Medicine

## 2021-08-28 ENCOUNTER — Other Ambulatory Visit: Payer: Self-pay

## 2021-08-28 DIAGNOSIS — D1803 Hemangioma of intra-abdominal structures: Secondary | ICD-10-CM | POA: Diagnosis not present

## 2021-08-28 DIAGNOSIS — K7689 Other specified diseases of liver: Secondary | ICD-10-CM | POA: Diagnosis present

## 2021-08-28 IMAGING — MR MR ABDOMEN WO/W CM
18 series · 48 of 48 positions shown · IV contrast (gadavist)
Comparison: CT abdomen pelvis, [DATE]

CLINICAL DATA: Characterize liver lesions incidentally identified
by prior CT

EXAM:
MRI ABDOMEN WITHOUT AND WITH CONTRAST
TECHNIQUE: Multiplanar multisequence MR imaging of the abdomen was performed
both before and after the administration of intravenous contrast.
CONTRAST:  7.5mL GADAVIST GADOBUTROL 1 MMOL/ML IV SOLN

[Series 3: T2 · coronal · 6.0mm · 1.19mm/px · 2 of 30 slices shown (1 of 2)]
[im 1/30]
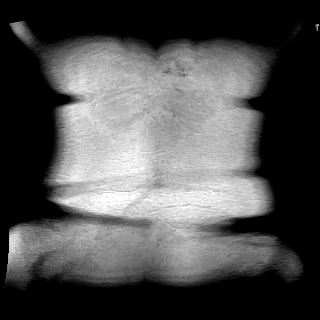
[im 30/30]
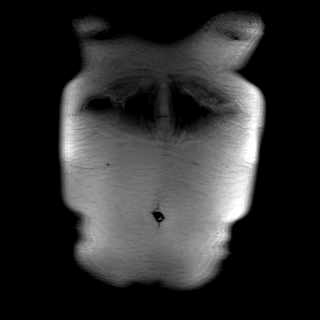

[Series 4: T2 · axial · 6.0mm · 1.19mm/px · z∈[-3,+220]mm · 2 of 32 slices shown (2 of 2)]
[im 1/32]
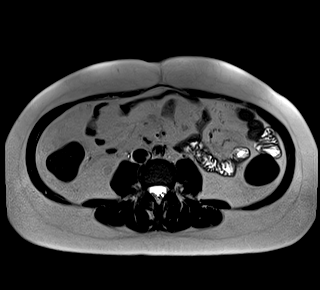
[im 32/32]
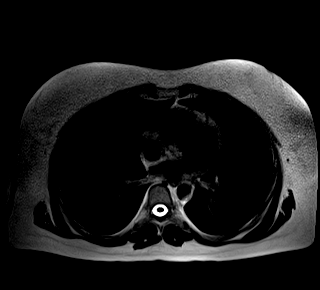

[Series 6: T2 fat-sat · axial · 6.0mm · 1.19mm/px · z∈[-11,+226]mm · 2 of 34 slices shown]
[im 1/34]
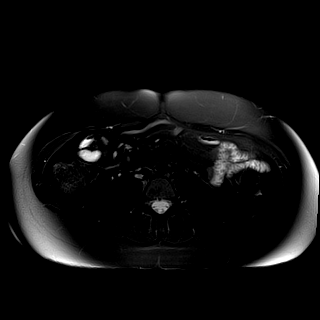
[im 34/34]
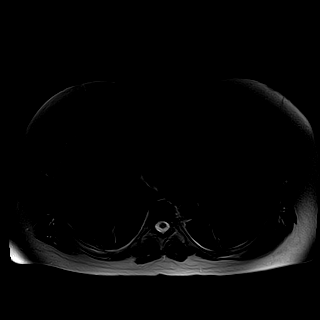

[Series 7: ax dwi_tracew · axial · 6.0mm · 1.42mm/px · z∈[-11,+226]mm · 4 of 102 slices shown]
[im 1/102]
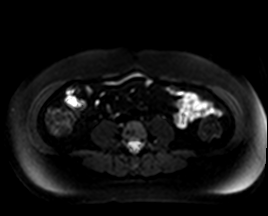
[im 34/102]
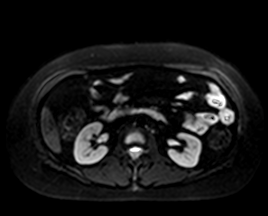
[im 68/102]
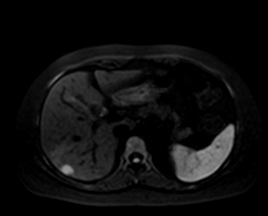
[im 102/102]
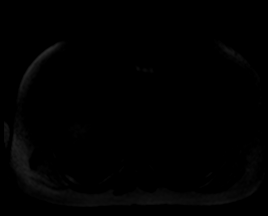

[Series 8: ax dwi_adc · axial · 6.0mm · 1.42mm/px · 1 of 34 slices shown]
[im 1/34]
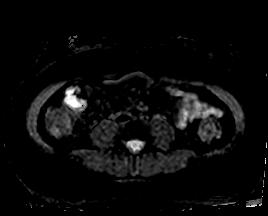

[Series 9: in & out · axial · 3.0mm · 1.19mm/px · z∈[+2,+215]mm · 3 of 72 slices shown (1 of 2)]
[im 1/72]
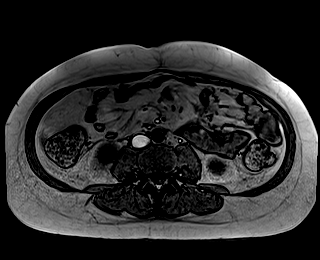
[im 36/72]
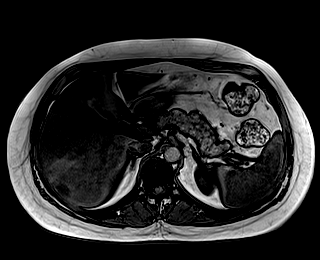
[im 72/72]
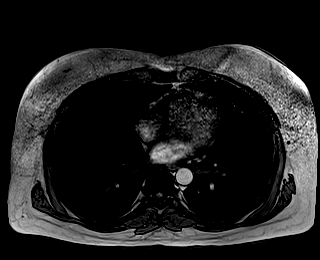

[Series 10: in & out · axial · 3.0mm · 1.19mm/px · z∈[+2,+215]mm · 3 of 72 slices shown (2 of 2)]
[im 1/72]
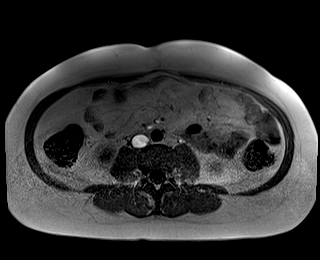
[im 36/72]
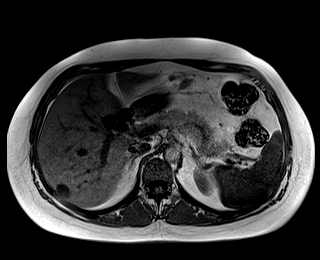
[im 72/72]
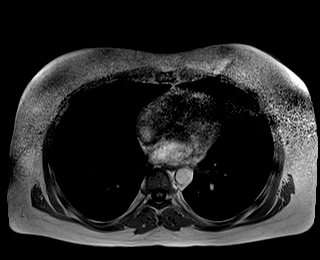

[Series 11: bSSFP · axial · 6.0mm · 0.74mm/px · 1 of 32 slices shown]
[im 1/32]
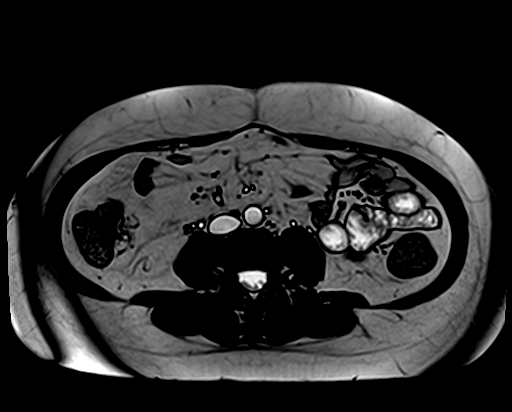

[Series 12: T1 dynamic fat-sat · axial · non-contrast · 3.0mm · 1.19mm/px · z∈[-10,+227]mm · 3 of 80 slices shown (1 of 5)]
[im 1/80]
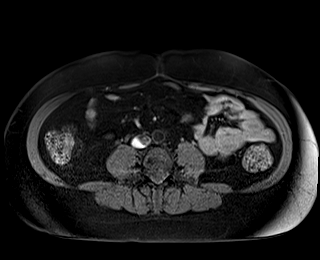
[im 40/80]
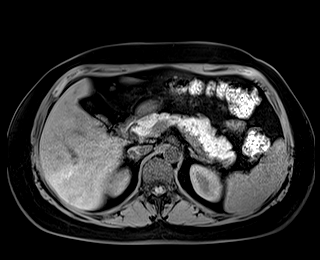
[im 80/80]
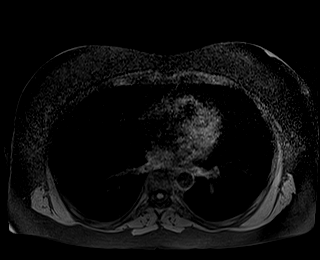

[Series 13: T1 dynamic fat-sat post-contrast · axial · 3.0mm · 1.19mm/px · z∈[-10,+227]mm · 3 of 80 slices shown (1 of 4)]
[im 1/80]
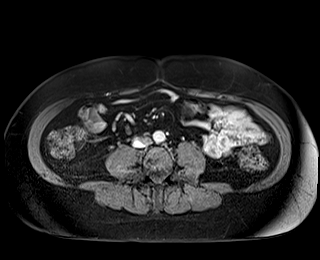
[im 40/80]
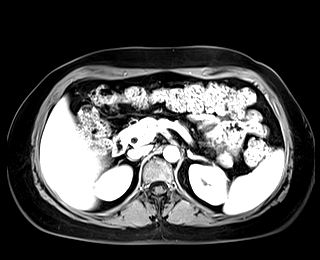
[im 80/80]
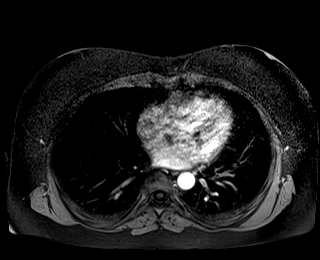

[Series 14: T1 dynamic fat-sat · axial · 3.0mm · 1.19mm/px · z∈[-10,+227]mm · 3 of 80 slices shown (2 of 5)]
[im 1/80]
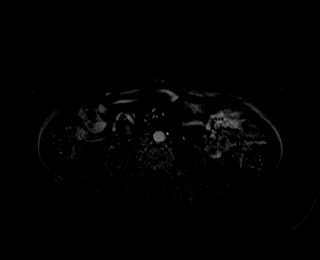
[im 40/80]
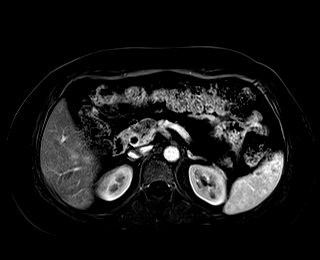
[im 80/80]
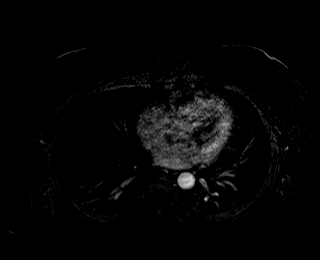

[Series 15: T1 dynamic fat-sat post-contrast · axial · 3.0mm · 1.19mm/px · z∈[-10,+227]mm · 3 of 80 slices shown (2 of 4)]
[im 1/80]
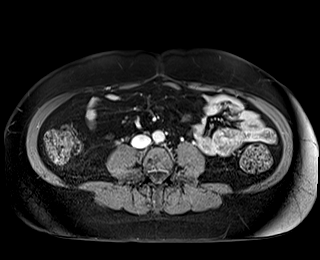
[im 40/80]
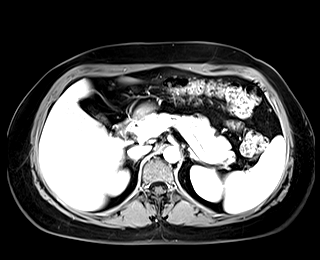
[im 80/80]
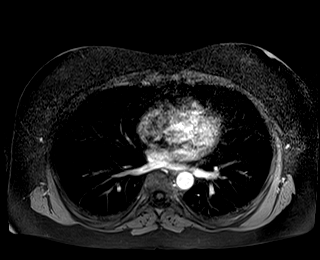

[Series 16: T1 dynamic fat-sat · axial · 3.0mm · 1.19mm/px · z∈[-10,+227]mm · 3 of 80 slices shown (3 of 5)]
[im 1/80]
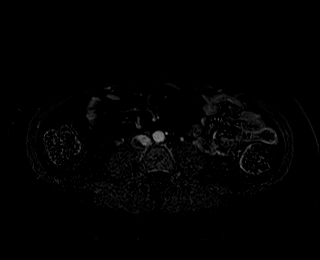
[im 40/80]
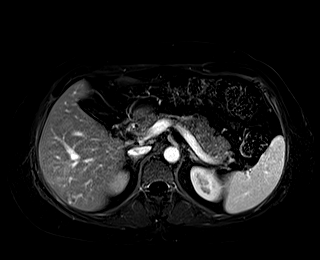
[im 80/80]
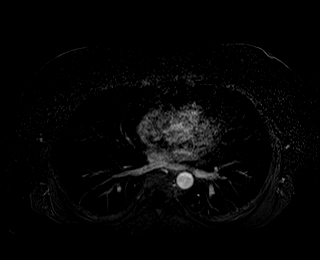

[Series 17: T1 dynamic fat-sat post-contrast · axial · 3.0mm · 1.19mm/px · z∈[-10,+227]mm · 3 of 80 slices shown (3 of 4)]
[im 1/80]
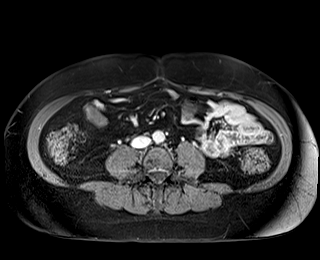
[im 40/80]
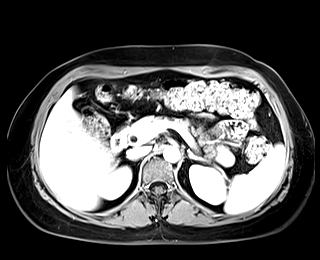
[im 80/80]
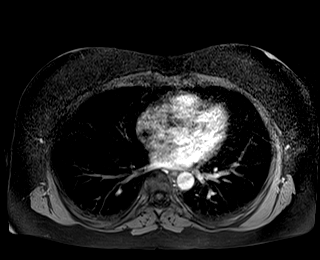

[Series 18: T1 dynamic fat-sat · axial · 3.0mm · 1.19mm/px · z∈[-10,+227]mm · 3 of 80 slices shown (4 of 5)]
[im 1/80]
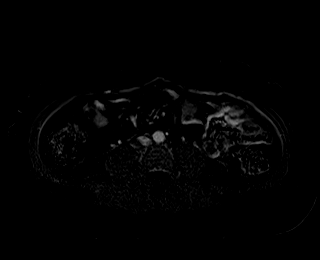
[im 40/80]
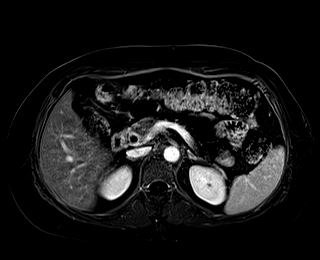
[im 80/80]
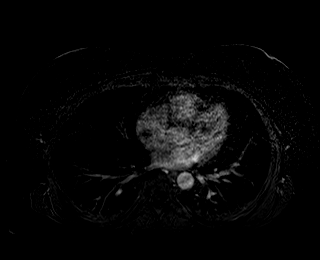

[Series 19: T1 dynamic post-contrast · coronal · 3.0mm · 1.31mm/px · 3 of 72 slices shown]
[im 1/72]
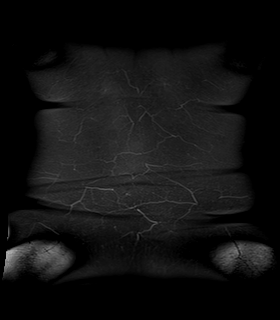
[im 36/72]
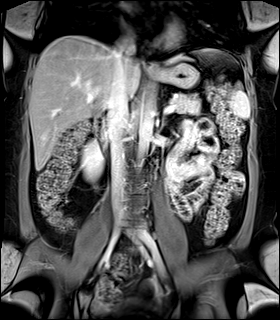
[im 72/72]
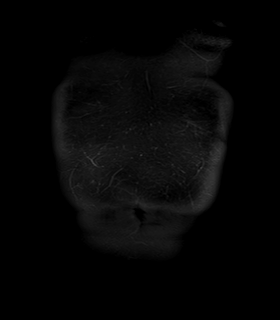

[Series 20: T1 dynamic fat-sat post-contrast · axial · 3.0mm · 1.19mm/px · z∈[-10,+227]mm · 3 of 80 slices shown (4 of 4)]
[im 1/80]
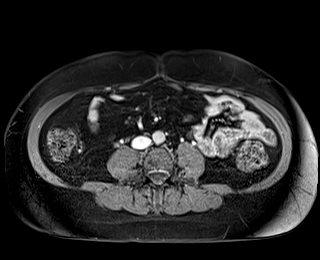
[im 40/80]
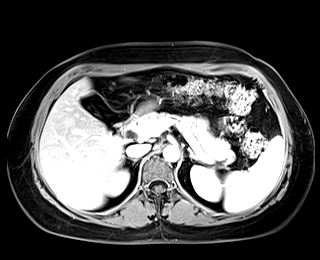
[im 80/80]
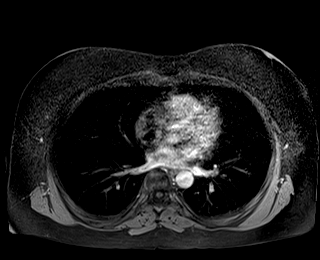

[Series 21: T1 dynamic fat-sat · axial · 3.0mm · 1.19mm/px · z∈[-10,+227]mm · 3 of 80 slices shown (5 of 5)]
[im 1/80]
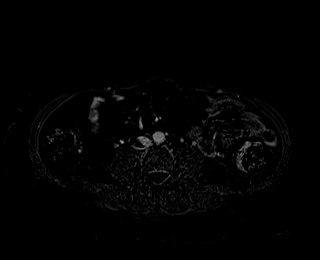
[im 40/80]
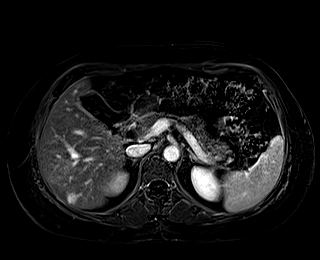
[im 80/80]
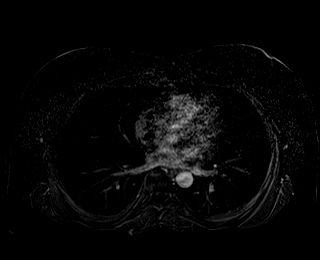

[48 of 48 positions shown; findings below may reference images not displayed]

FINDINGS: Lower chest: No acute findings.

Hepatobiliary: Mild hepatic steatosis. Irregular regions of focal
fatty sparing in the peripheral right lobe of the liver (series 9,
image 32). Three lesions of the left and right lobe of the liver
identified by prior CT demonstrate progressive, discontinuous
peripheral nodular contrast enhancement on multiphasic MRI lumbar
consistent with definitively benign hepatic hemangiomata. The
largest lesion in the posterior left lobe measures 2.5 cm (series
17, image 24). There are additional lesions in the left and right
lobe liver with subtle intrinsic T2 hyperintensity, which
demonstrate varying contrast enhancement characteristics, a lesion
of the posterior right lobe of the liver, hepatic segment VI,
demonstrating right breast contrast enhancement and measuring 1.6 x
1.5 cm (series 20, image 32), a lesion of the anterior inferior
right lobe of the liver, hepatic segment V, demonstrating arterial
contrast enhancement fade to parenchymal background on multiphasic
sequences, measuring 1.3 x 1.2 cm (series 13, image 36), and a
subcapsular lesion of the anterior left lobe liver, hepatic segment
II, demonstrating early arterial hyperenhancement and relative ice
enhancement on multiphasic sequences, measuring 0.6 cm (series 20,
image 28). No solid mass or other parenchymal abnormality
identified. Status post cholecystectomy. Mild postoperative biliary
ductal dilatation.

Pancreas: No mass, inflammatory changes, or other parenchymal
abnormality identified. No pancreatic ductal dilatation

Spleen:  Within normal limits in size and appearance.

Adrenals/Urinary Tract: No masses identified. No evidence of
hydronephrosis.

Stomach/Bowel: Visualized portions within the abdomen are
unremarkable.

Vascular/Lymphatic: No pathologically enlarged lymph nodes
identified. No abdominal aortic aneurysm demonstrated.

Other:  None.

Musculoskeletal: No suspicious bone lesions identified.
IMPRESSION: 1. Three lesions of the left and right lobe of the liver identified
by CT are consistent with definitively benign hemangiomata. The
largest lesion in the posterior left lobe measures 2.5 cm. No
further follow-up or characterization is specifically required for
these lesions.
2. There are additional lesions in the left and right lobe of the
liver, which demonstrate varying contrast enhancement
characteristics detailed above, a lesion of the posterior right lobe
of the liver, hepatic segment VI, measuring 1.6 x 1.5 cm, a lesion
of the anterior inferior right lobe of the liver, hepatic segment V,
measuring 1.3 x 1.2 cm, and a 0.6 cm subcapsular lesion of the
anterior left lobe liver, hepatic segment II. These most likely
reflect additional small incidental focal nodular hyperplasias,
however hepatic adenoma is a differential consideration. Recommend
Eovist contrast enhanced MRI to more clearly distinguish these
entities, as hepatic adenomata may have surgical implications.
3. Hepatic steatosis.
4. Status post cholecystectomy.

## 2021-08-28 MED ORDER — GADOBUTROL 1 MMOL/ML IV SOLN
7.5000 mL | Freq: Once | INTRAVENOUS | Status: AC | PRN
  Administered 2021-08-28: 7.5 mL via INTRAVENOUS

## 2023-10-16 ENCOUNTER — Other Ambulatory Visit: Payer: Self-pay | Admitting: Physician Assistant

## 2023-10-16 ENCOUNTER — Ambulatory Visit
Admission: RE | Admit: 2023-10-16 | Discharge: 2023-10-16 | Disposition: A | Discharge status: Home / Self Care | Payer: BC Managed Care – PPO | Source: Ambulatory Visit | Attending: Physician Assistant | Admitting: Physician Assistant

## 2023-10-16 DIAGNOSIS — R112 Nausea with vomiting, unspecified: Secondary | ICD-10-CM | POA: Diagnosis present

## 2023-10-16 MED ORDER — IOHEXOL 300 MG/ML  SOLN
100.0000 mL | Freq: Once | INTRAMUSCULAR | Status: AC | PRN
  Administered 2023-10-16: 100 mL via INTRAVENOUS

## 2024-11-26 ENCOUNTER — Other Ambulatory Visit: Payer: Self-pay | Admitting: Internal Medicine

## 2024-11-26 DIAGNOSIS — Z1231 Encounter for screening mammogram for malignant neoplasm of breast: Secondary | ICD-10-CM

## 2024-11-30 ENCOUNTER — Other Ambulatory Visit: Payer: Self-pay | Admitting: Internal Medicine

## 2024-11-30 DIAGNOSIS — N946 Dysmenorrhea, unspecified: Secondary | ICD-10-CM

## 2024-12-23 ENCOUNTER — Ambulatory Visit
